# Patient Record
Sex: Female | Born: 1995 | Race: Black or African American | Hispanic: No | Marital: Single | Smoking: Never smoker
Health system: Southern US, Community
[De-identification: ages and names within clinical notes are randomized; demographics above are authoritative.]

## PROBLEM LIST (undated history)

## (undated) DIAGNOSIS — D649 Anemia, unspecified: Secondary | ICD-10-CM

## (undated) HISTORY — PX: NO PAST SURGERIES: SHX2092

## (undated) HISTORY — DX: Anemia, unspecified: D64.9

---
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
## 2017-11-03 ENCOUNTER — Encounter: Payer: Self-pay | Admitting: Internal Medicine

## 2017-11-03 ENCOUNTER — Ambulatory Visit: Payer: Self-pay | Admitting: Internal Medicine

## 2017-11-03 VITALS — BP 124/70 | HR 76 | Resp 12 | Ht 67.0 in | Wt 226.0 lb

## 2017-11-03 DIAGNOSIS — Z0000 Encounter for general adult medical examination without abnormal findings: Secondary | ICD-10-CM

## 2017-11-03 DIAGNOSIS — E010 Iodine-deficiency related diffuse (endemic) goiter: Secondary | ICD-10-CM

## 2017-11-03 DIAGNOSIS — D509 Iron deficiency anemia, unspecified: Secondary | ICD-10-CM

## 2017-11-03 DIAGNOSIS — D649 Anemia, unspecified: Secondary | ICD-10-CM | POA: Insufficient documentation

## 2017-11-03 LAB — POCT URINALYSIS DIPSTICK
BILIRUBIN UA: NEGATIVE
Glucose, UA: NEGATIVE
KETONES UA: NEGATIVE
Leukocytes, UA: NEGATIVE
Nitrite, UA: NEGATIVE
Protein, UA: POSITIVE — AB
Urobilinogen, UA: 0.2 E.U./dL
pH, UA: 6 (ref 5.0–8.0)

## 2017-11-03 NOTE — Patient Instructions (Signed)
Drink a glass of water before every meal Drink 6-8 glasses of water daily Eat three meals daily Eat a protein and healthy fat with every meal (eggs,fish, chicken, [place] and limit red meats) Eat 5 servings of vegetables daily, mix the colors Eat 2 servings of fruit daily with skin, if skin is edible Use smaller plates Put food/utensils down as you chew and swallow each bite Eat at a table with friends/family at least once daily, no TV Do not eat in front of the TV  Recent studies show that people who consume all of their calories in a 12 hour period lose weight more efficiently.  For example, if you eat your first meal at 7:00 a.m., your last meal of the day should be completed by 7:00 p.m.  Call if you decide to cancel the surgery

## 2017-11-03 NOTE — Progress Notes (Signed)
Subjective:    Patient ID: Beth Camacho, female    DOB: 02/22/1995, 22 y.o.   MRN: [id number]  HPI   Here to establish  Plans for liposuction with [facility] in [facility].  Patient needs medical clearance and labs done so she can proceed.   States she has tried everything possible to lose her midsection, but unable to do so, so is planning for the liposuction. She was in good shape in high school with sports.  Got a job after high school with [employer] and gained a lot of weight Tried to lose weight over past year with diet and exercise, but unable to get her midsection down. Weighed 150 lb in high school.  Diet/lifestyle: Gets up at 10 a.m. Lives in a house with roommates  First intake:  1-3 pm: cabbage/foufou.  Juice: orange or grape drink.  May have grilled chicken white rice.  Spaghetti.    6-7 pm.:  Sometimes same as earlier.  Juice sometimes water.    Bedtime at 1-2 a.m.  Later states she is now working 2nd shift in [place] [employer]:  At a [employer].  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  Works from 5:30 pm to 2:30 am M-F.   Not taking a full load of classes currently and doing fine in classes.  After November, quitting job.        Immunization History  Administered Date(s) Administered  . Hpv 10/06/2007  . Td 12/02/2007  . Tdap 06/01/2007     No outpatient medications have been marked as taking for the 11/03/17 encounter (Office Visit) with Julieanne Manson, MD.    No Known Allergies   Past Medical History:  Diagnosis Date  . Anemia    iron deficiency--noted when tried to give blood in past    History reviewed. No pertinent surgical history.   History reviewed. No pertinent family history.   Social History   Socioeconomic History  . Marital status: Single    Spouse name: Not on file  . Number of children: 0  . Years of education: Not on file  . Highest education level: Not on file    Occupational History  . 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 Financial resource strain: Not on file  . Food insecurity:    Worry: Never true    Inability: Never true  . Transportation needs:    Medical: Yes    Non-medical: Yes  Tobacco Use  . Smoking status: Never Smoker  . Smokeless tobacco: Never Used  Substance and Sexual Activity  . Alcohol use: No  . Drug use: No  . Sexual activity: Not Currently    Comment: no intercourse since 09/2016  Lifestyle  . Physical activity:    Days per week: Not on file    Minutes per session: Not on file  . Stress: Not on file  Relationships  . Social connections:    Talks on phone: Not on file    Gets together: Not on file    Attends religious service: Not on file    Active member of club or organization: Not on file    Attends meetings of clubs or organizations: Not on file    Relationship status: Not on file  . Intimate partner violence:    Fear of current or ex partner: Not on file    Emotionally abused: No    Physically abused: No  Forced sexual activity: Not on file  Other Topics Concern  . Not on file  Social History Narrative   Parents live in [place]   She is a Consulting civil engineer at [employer], Holiday representative in business   Lives with roommates near campus    Review of Systems     Objective:   Physical Exam Obese, NAD HEENT: PERRL, EOMI, discs sharp, TMs pearly gray, throat without injection. Neck:  Supple, No adenopathy, prominent anterior skin fold vs somewhat prominent thyroid Chest:  CTA CV:  RRR with normal S1 and S2, No S3, S4 or murmur.  Radial and DP pulses normal and equal Abd:  S, NT, No HSM or mass, + BS LE: No edema Neuro:  A & O x 3, CN II-XII grossly intact.  DTRs 2+/4 throughout, Motor 5/5 throughout.  Gait normal       Assessment & Plan:  1.  Medical clearance for surgery Labs requested preop ordered.  Adding a TSH, though suspect this is just her neck fold Nothing in her  history or exam to preclude her from surgery Strongly urged her to put the liposuction on hold and work with [place] at clinic on lifestyle changes to improve her weight. Also encouraged her to let her parents know she is planning this as she is heading to [place]. Based on her current diet and lifestyle, am concerned she will just regain the weight as has not made appropriate changes. Urged her to have protein and good fat with each meal and cut back on all her carbs. Discussed her diet at length. Also to work in physical activity during day and then an hour of regular physical activity just for her daily. She will think about this.

## 2017-11-03 NOTE — Progress Notes (Signed)
Social work Tax inspector (SWI) assessed Beth Camacho for mental health symptoms and social determinants of health. Over the past two weeks she has had trouble falling asleep, has felt tired, and has felt stress nearly every day. She has overeaten and felt anxious more than half the days. Patient denied any suicidal thoughts.   [name] has had no food insecurities, transportation challenges, or incidences of physical or emotional abuse over the past year. She has had some transportation challenges and stress about getting the bills paid on time. [name] made a counseling appointment with the SWI to discuss her anxiety.

## 2017-11-04 LAB — PROTIME-INR
INR: 1 (ref 0.8–1.2)
PROTHROMBIN TIME: 10.8 s (ref 9.1–12.0)

## 2017-11-04 LAB — CBC WITH DIFFERENTIAL/PLATELET
BASOS ABS: 0 10*3/uL (ref 0.0–0.2)
Basos: 1 %
EOS (ABSOLUTE): 0 10*3/uL (ref 0.0–0.4)
Eos: 1 %
HEMOGLOBIN: 10.5 g/dL — AB (ref 11.1–15.9)
Hematocrit: 33.5 % — ABNORMAL LOW (ref 34.0–46.6)
IMMATURE GRANS (ABS): 0 10*3/uL (ref 0.0–0.1)
IMMATURE GRANULOCYTES: 0 %
Lymphocytes Absolute: 2.1 10*3/uL (ref 0.7–3.1)
Lymphs: 37 %
MCH: 22.4 pg — ABNORMAL LOW (ref 26.6–33.0)
MCHC: 31.3 g/dL — ABNORMAL LOW (ref 31.5–35.7)
MCV: 71 fL — ABNORMAL LOW (ref 79–97)
MONOCYTES: 7 %
Monocytes Absolute: 0.4 10*3/uL (ref 0.1–0.9)
NEUTROS PCT: 54 %
Neutrophils Absolute: 3.1 10*3/uL (ref 1.4–7.0)
PLATELETS: 342 10*3/uL (ref 150–450)
RBC: 4.69 x10E6/uL (ref 3.77–5.28)
RDW: 15.1 % (ref 12.3–15.4)
WBC: 5.7 10*3/uL (ref 3.4–10.8)

## 2017-11-04 LAB — COMPREHENSIVE METABOLIC PANEL
A/G RATIO: 1.1 — AB (ref 1.2–2.2)
ALT: 8 IU/L (ref 0–32)
AST: 16 IU/L (ref 0–40)
Albumin: 4 g/dL (ref 3.5–5.5)
Alkaline Phosphatase: 104 IU/L (ref 39–117)
BILIRUBIN TOTAL: 0.3 mg/dL (ref 0.0–1.2)
BUN/Creatinine Ratio: 19 (ref 9–23)
BUN: 11 mg/dL (ref 6–20)
CALCIUM: 9.1 mg/dL (ref 8.7–10.2)
CHLORIDE: 106 mmol/L (ref 96–106)
CO2: 22 mmol/L (ref 20–29)
Creatinine, Ser: 0.59 mg/dL (ref 0.57–1.00)
GFR calc non Af Amer: 132 mL/min/{1.73_m2} (ref >59)
GFR, EST AFRICAN AMERICAN: 153 mL/min/{1.73_m2} (ref >59)
GLUCOSE: 83 mg/dL (ref 65–99)
Globulin, Total: 3.5 g/dL (ref 1.5–4.5)
POTASSIUM: 4.2 mmol/L (ref 3.5–5.2)
Sodium: 142 mmol/L (ref 134–144)
TOTAL PROTEIN: 7.5 g/dL (ref 6.0–8.5)

## 2017-11-04 LAB — HIV ANTIBODY (ROUTINE TESTING W REFLEX): HIV SCREEN 4TH GENERATION: NONREACTIVE

## 2017-11-04 LAB — BETA HCG QUANT (REF LAB): hCG Quant: <1 m[IU]/mL

## 2017-11-04 LAB — APTT: APTT: 25 s (ref 24–33)

## 2017-11-04 LAB — TSH: TSH: 2.26 u[IU]/mL (ref 0.450–4.500)

## 2017-11-12 ENCOUNTER — Other Ambulatory Visit: Payer: Self-pay | Admitting: Licensed Clinical Social Worker

## 2017-11-25 ENCOUNTER — Other Ambulatory Visit: Payer: Self-pay

## 2017-11-25 DIAGNOSIS — D509 Iron deficiency anemia, unspecified: Secondary | ICD-10-CM

## 2017-11-26 LAB — CBC WITH DIFFERENTIAL/PLATELET
BASOS ABS: 0 10*3/uL (ref 0.0–0.2)
Basos: 0 %
EOS (ABSOLUTE): 0 10*3/uL (ref 0.0–0.4)
Eos: 0 %
HEMOGLOBIN: 10.8 g/dL — AB (ref 11.1–15.9)
Hematocrit: 35.7 % (ref 34.0–46.6)
Immature Grans (Abs): 0 10*3/uL (ref 0.0–0.1)
Immature Granulocytes: 0 %
LYMPHS ABS: 3.3 10*3/uL — AB (ref 0.7–3.1)
Lymphs: 48 %
MCH: 22.5 pg — ABNORMAL LOW (ref 26.6–33.0)
MCHC: 30.3 g/dL — AB (ref 31.5–35.7)
MCV: 75 fL — ABNORMAL LOW (ref 79–97)
MONOCYTES: 8 %
Monocytes Absolute: 0.5 10*3/uL (ref 0.1–0.9)
NEUTROS PCT: 44 %
Neutrophils Absolute: 3.1 10*3/uL (ref 1.4–7.0)
Platelets: 330 10*3/uL (ref 150–450)
RBC: 4.79 x10E6/uL (ref 3.77–5.28)
RDW: 16.3 % — AB (ref 12.3–15.4)
WBC: 7 10*3/uL (ref 3.4–10.8)

## 2018-02-07 ENCOUNTER — Ambulatory Visit: Payer: Self-pay | Admitting: Internal Medicine

## 2018-11-09 ENCOUNTER — Ambulatory Visit: Payer: Self-pay | Admitting: Internal Medicine

## 2018-11-09 ENCOUNTER — Encounter: Payer: Self-pay | Admitting: Internal Medicine

## 2019-07-25 ENCOUNTER — Ambulatory Visit: Payer: Self-pay | Admitting: Internal Medicine

## 2019-09-05 ENCOUNTER — Ambulatory Visit: Payer: Self-pay | Admitting: Internal Medicine

## 2021-01-10 ENCOUNTER — Ambulatory Visit: Payer: Self-pay | Admitting: Internal Medicine

## 2021-10-11 ENCOUNTER — Encounter (HOSPITAL_COMMUNITY): Payer: Self-pay

## 2021-10-11 ENCOUNTER — Other Ambulatory Visit: Payer: Self-pay

## 2021-10-11 ENCOUNTER — Emergency Department (HOSPITAL_COMMUNITY)
Admission: EM | Admit: 2021-10-11 | Discharge: 2021-10-11 | Disposition: A | Discharge status: Home / Self Care | Payer: Self-pay | Attending: Emergency Medicine | Admitting: Emergency Medicine

## 2021-10-11 DIAGNOSIS — N3000 Acute cystitis without hematuria: Secondary | ICD-10-CM | POA: Insufficient documentation

## 2021-10-11 LAB — URINALYSIS, ROUTINE W REFLEX MICROSCOPIC
Bacteria, UA: NONE SEEN
Bilirubin Urine: NEGATIVE
Glucose, UA: NEGATIVE mg/dL
Ketones, ur: NEGATIVE mg/dL
Nitrite: NEGATIVE
Protein, ur: NEGATIVE mg/dL
RBC / HPF: >50 RBC/hpf — ABNORMAL HIGH (ref 0–5)
Specific Gravity, Urine: 1.02 (ref 1.005–1.030)
WBC, UA: >50 WBC/hpf — ABNORMAL HIGH (ref 0–5)
pH: 5 (ref 5.0–8.0)

## 2021-10-11 LAB — I-STAT BETA HCG BLOOD, ED (MC, WL, AP ONLY): I-stat hCG, quantitative: <5 m[IU]/mL (ref <5)

## 2021-10-11 MED ORDER — CEPHALEXIN 500 MG PO CAPS
500.0000 mg | ORAL_CAPSULE | Freq: Once | ORAL | Status: AC
  Administered 2021-10-11: 500 mg via ORAL
  Filled 2021-10-11: qty 1

## 2021-10-11 MED ORDER — FLUCONAZOLE 150 MG PO TABS: 150.0000 mg | ORAL_TABLET | Freq: Every day | ORAL | 0 refills | Status: AC

## 2021-10-11 MED ORDER — CEPHALEXIN 500 MG PO CAPS: 500.0000 mg | ORAL_CAPSULE | Freq: Two times a day (BID) | ORAL | 0 refills | Status: AC

## 2021-10-11 NOTE — ED Provider Notes (Signed)
[facility] Emergency Department Provider Note MRN:  [id number]  Arrival date & time: 10/11/21     Chief Complaint   Urinary Frequency   History of Present Illness   Beth Camacho is a 26 y.o. year-old female presents to the ED with chief complaint of dysuria, urinary frequency, and urgency.  She states that she has the symptoms intermittently, but this episode has been going on for the past week.  She denies fevers or chills.  Denies nausea or vomiting.  Denies any low back pain.  Additionally, she states that she has had some thick white vaginal discharge is also intermittent..     Review of Systems  Pertinent positive and negative review of systems noted in HPI.    Physical Exam   Vitals:   10/11/21 2026 10/11/21 2030  BP: 139/87   Resp: 18   Temp: 98.3 F (36.8 C)   SpO2: 100% 100%    CONSTITUTIONAL:  well-appearing, NAD NEURO:  Alert and oriented x 3, CN 3-12 grossly intact EYES:  eyes equal and reactive ENT/NECK:  Supple, no stridor  CARDIO:  appears well-perfused  PULM:  No respiratory distress,  GI/GU:  non-distended, non tender MSK/SPINE:  No gross deformities, no edema, moves all extremities  SKIN:  no rash, atraumatic   *Additional and/or pertinent findings included in MDM below  Diagnostic and Interventional Summary    EKG Interpretation  Date/Time:    Ventricular Rate:    PR Interval:    QRS Duration:   QT Interval:    QTC Calculation:   R Axis:     Text Interpretation:         Labs Reviewed  URINALYSIS, ROUTINE W REFLEX MICROSCOPIC - Abnormal; Notable for the following components:      Result Value   APPearance HAZY (*)    Hgb urine dipstick LARGE (*)    Leukocytes,Ua LARGE (*)    RBC / HPF >50 (*)    WBC, UA >50 (*)    All other components within normal limits  I-STAT BETA HCG BLOOD, ED ([facility], [facility], [facility] ONLY)    No orders to display    Medications  cephALEXin (KEFLEX) capsule 500 mg (500 mg Oral Given  10/11/21 2318)     Procedures  /  Critical Care Procedures  ED Course and Medical Decision Making  I have reviewed the triage vital signs, the nursing notes, and pertinent available records from the EMR.  Social Determinants Affecting Complexity of Care: Patient has no clinically significant social determinants affecting this chief complaint..   ED Course:    Medical Decision Making Patient here with dysuria and vaginal discharge.  UA is consistent with infection.  Pregnancy test negative.  Offered self swab versus referral, but patient states that she would like to try the antibiotics and Diflucan.  She will follow-up if not improving.  Amount and/or Complexity of Data Reviewed Labs:     Details: Urinalysis ordered in triage is consistent with infection Pregnancy test negative  Risk Prescription drug management.     Consultants: No consultations were needed in caring for this patient.   Treatment and Plan: Emergency department workup does not suggest an emergent condition requiring admission or immediate intervention beyond  what has been performed at this time. The patient is safe for discharge and has  been instructed to return immediately for worsening symptoms, change in  symptoms or any other concerns    Final Clinical Impressions(s) / ED Diagnoses  ICD-10-CM   1. Acute cystitis without hematuria  N30.00       ED Discharge Orders          Ordered    cephALEXin (KEFLEX) 500 MG capsule  2 times daily        10/11/21 2317    fluconazole (DIFLUCAN) 150 MG tablet  Daily        10/11/21 2317              Discharge Instructions Discussed with and Provided to Patient:    Discharge Instructions      If you do not improve with the antibiotics, please follow-up with your doctor or a gynecologist.       Roxy Horseman, PA-C 10/11/21 2322    Nira Conn, MD 10/12/21 [phone]

## 2021-10-11 NOTE — ED Triage Notes (Signed)
Patient has been experiencing painful urination with frequency for a few months.

## 2021-10-11 NOTE — Discharge Instructions (Signed)
If you do not improve with the antibiotics, please follow-up with your doctor or a gynecologist.

## 2022-02-28 ENCOUNTER — Emergency Department (HOSPITAL_COMMUNITY): Payer: Self-pay

## 2022-02-28 ENCOUNTER — Encounter (HOSPITAL_COMMUNITY)
Admission: EM | Disposition: A | Discharge status: Home / Self Care | Payer: Self-pay | Source: Home / Self Care | Attending: Emergency Medicine

## 2022-02-28 ENCOUNTER — Emergency Department (HOSPITAL_BASED_OUTPATIENT_CLINIC_OR_DEPARTMENT_OTHER): Payer: Self-pay | Admitting: Anesthesiology

## 2022-02-28 ENCOUNTER — Encounter (HOSPITAL_COMMUNITY): Payer: Self-pay

## 2022-02-28 ENCOUNTER — Emergency Department (HOSPITAL_COMMUNITY): Payer: Self-pay | Admitting: Anesthesiology

## 2022-02-28 ENCOUNTER — Observation Stay (HOSPITAL_COMMUNITY)
Admission: EM | Admit: 2022-02-28 | Discharge: 2022-02-28 | Disposition: A | Discharge status: Home / Self Care | Payer: Self-pay | Attending: Emergency Medicine | Admitting: Emergency Medicine

## 2022-02-28 DIAGNOSIS — Z3A00 Weeks of gestation of pregnancy not specified: Secondary | ICD-10-CM

## 2022-02-28 DIAGNOSIS — Z6791 Unspecified blood type, Rh negative: Secondary | ICD-10-CM

## 2022-02-28 DIAGNOSIS — O00111 Right tubal pregnancy with intrauterine pregnancy: Secondary | ICD-10-CM

## 2022-02-28 DIAGNOSIS — O00101 Right tubal pregnancy without intrauterine pregnancy: Principal | ICD-10-CM | POA: Insufficient documentation

## 2022-02-28 DIAGNOSIS — O009 Unspecified ectopic pregnancy without intrauterine pregnancy: Secondary | ICD-10-CM

## 2022-02-28 DIAGNOSIS — K661 Hemoperitoneum: Secondary | ICD-10-CM

## 2022-02-28 DIAGNOSIS — Z9889 Other specified postprocedural states: Secondary | ICD-10-CM

## 2022-02-28 HISTORY — PX: LAPAROSCOPIC BILATERAL SALPINGECTOMY: SHX5889

## 2022-02-28 LAB — CBC WITH DIFFERENTIAL/PLATELET
Abs Immature Granulocytes: 0.01 10*3/uL (ref 0.00–0.07)
Basophils Absolute: 0 10*3/uL (ref 0.0–0.1)
Basophils Relative: 1 %
Eosinophils Absolute: 0 10*3/uL (ref 0.0–0.5)
Eosinophils Relative: 1 %
HCT: 37.6 % (ref 36.0–46.0)
Hemoglobin: 11.9 g/dL — ABNORMAL LOW (ref 12.0–15.0)
Immature Granulocytes: 0 %
Lymphocytes Relative: 44 %
Lymphs Abs: 2.2 10*3/uL (ref 0.7–4.0)
MCH: 24 pg — ABNORMAL LOW (ref 26.0–34.0)
MCHC: 31.6 g/dL (ref 30.0–36.0)
MCV: 75.8 fL — ABNORMAL LOW (ref 80.0–100.0)
Monocytes Absolute: 0.4 10*3/uL (ref 0.1–1.0)
Monocytes Relative: 7 %
Neutro Abs: 2.3 10*3/uL (ref 1.7–7.7)
Neutrophils Relative %: 47 %
Platelets: 312 10*3/uL (ref 150–400)
RBC: 4.96 MIL/uL (ref 3.87–5.11)
RDW: 18 % — ABNORMAL HIGH (ref 11.5–15.5)
WBC: 5 10*3/uL (ref 4.0–10.5)
nRBC: 0 % (ref 0.0–0.2)

## 2022-02-28 LAB — URINALYSIS, ROUTINE W REFLEX MICROSCOPIC
Bilirubin Urine: NEGATIVE
Glucose, UA: NEGATIVE mg/dL
Ketones, ur: NEGATIVE mg/dL
Nitrite: NEGATIVE
Protein, ur: 30 mg/dL — AB
Specific Gravity, Urine: 1.031 — ABNORMAL HIGH (ref 1.005–1.030)
pH: 5 (ref 5.0–8.0)

## 2022-02-28 LAB — WET PREP, GENITAL
Sperm: NONE SEEN
Trich, Wet Prep: NONE SEEN
WBC, Wet Prep HPF POC: >=10 — AB (ref <10)

## 2022-02-28 LAB — COMPREHENSIVE METABOLIC PANEL
ALT: 10 U/L (ref 0–44)
AST: 14 U/L — ABNORMAL LOW (ref 15–41)
Albumin: 3.7 g/dL (ref 3.5–5.0)
Alkaline Phosphatase: 53 U/L (ref 38–126)
Anion gap: 7 (ref 5–15)
BUN: 7 mg/dL (ref 6–20)
CO2: 21 mmol/L — ABNORMAL LOW (ref 22–32)
Calcium: 9 mg/dL (ref 8.9–10.3)
Chloride: 109 mmol/L (ref 98–111)
Creatinine, Ser: 0.49 mg/dL (ref 0.44–1.00)
GFR, Estimated: >60 mL/min (ref >60)
Glucose, Bld: 106 mg/dL — ABNORMAL HIGH (ref 70–99)
Potassium: 3.5 mmol/L (ref 3.5–5.1)
Sodium: 137 mmol/L (ref 135–145)
Total Bilirubin: 0.5 mg/dL (ref 0.3–1.2)
Total Protein: 7.2 g/dL (ref 6.5–8.1)

## 2022-02-28 LAB — ABO/RH: ABO/RH(D): B NEG

## 2022-02-28 LAB — TYPE AND SCREEN
ABO/RH(D): B NEG
ABO/RH(D): B NEG
Antibody Screen: NEGATIVE
Antibody Screen: NEGATIVE

## 2022-02-28 LAB — LIPASE, BLOOD: Lipase: 39 U/L (ref 11–51)

## 2022-02-28 LAB — HCG, QUANTITATIVE, PREGNANCY: hCG, Beta Chain, Quant, S: 25664 m[IU]/mL — ABNORMAL HIGH (ref <5)

## 2022-02-28 LAB — I-STAT BETA HCG BLOOD, ED (MC, WL, AP ONLY): I-stat hCG, quantitative: >2000 m[IU]/mL — ABNORMAL HIGH (ref <5)

## 2022-02-28 SURGERY — SALPINGECTOMY, BILATERAL, LAPAROSCOPIC
Anesthesia: General | Laterality: Right

## 2022-02-28 MED ORDER — HYDROMORPHONE HCL 1 MG/ML IJ SOLN
0.2500 mg | INTRAMUSCULAR | Status: DC | PRN
  Administered 2022-02-28 (×2): 0.5 mg via INTRAVENOUS

## 2022-02-28 MED ORDER — HYDROMORPHONE HCL 1 MG/ML IJ SOLN
INTRAMUSCULAR | Status: AC
  Filled 2022-02-28: qty 1

## 2022-02-28 MED ORDER — AMISULPRIDE (ANTIEMETIC) 5 MG/2ML IV SOLN
10.0000 mg | Freq: Once | INTRAVENOUS | Status: AC | PRN
  Administered 2022-02-28: 10 mg via INTRAVENOUS

## 2022-02-28 MED ORDER — SUGAMMADEX SODIUM 200 MG/2ML IV SOLN
INTRAVENOUS | Status: DC | PRN
  Administered 2022-02-28: 400 mg via INTRAVENOUS

## 2022-02-28 MED ORDER — ROCURONIUM BROMIDE 10 MG/ML (PF) SYRINGE
PREFILLED_SYRINGE | INTRAVENOUS | Status: DC | PRN
  Administered 2022-02-28: 40 mg via INTRAVENOUS

## 2022-02-28 MED ORDER — PROPOFOL 10 MG/ML IV BOLUS
INTRAVENOUS | Status: DC | PRN
  Administered 2022-02-28: 200 mg via INTRAVENOUS

## 2022-02-28 MED ORDER — BUPIVACAINE HCL (PF) 0.5 % IJ SOLN
INTRAMUSCULAR | Status: AC
  Filled 2022-02-28: qty 30

## 2022-02-28 MED ORDER — OXYCODONE HCL 5 MG PO TABS
ORAL_TABLET | ORAL | Status: AC
  Filled 2022-02-28: qty 1

## 2022-02-28 MED ORDER — LACTATED RINGERS IV SOLN: INTRAVENOUS | Status: DC

## 2022-02-28 MED ORDER — OXYCODONE-ACETAMINOPHEN 5-325 MG PO TABS: 1.0000 | ORAL_TABLET | Freq: Four times a day (QID) | ORAL | 0 refills | Status: AC | PRN

## 2022-02-28 MED ORDER — MEPERIDINE HCL 25 MG/ML IJ SOLN: 6.2500 mg | INTRAMUSCULAR | Status: DC | PRN

## 2022-02-28 MED ORDER — SUCCINYLCHOLINE CHLORIDE 200 MG/10ML IV SOSY
PREFILLED_SYRINGE | INTRAVENOUS | Status: DC | PRN
  Administered 2022-02-28: 100 mg via INTRAVENOUS

## 2022-02-28 MED ORDER — MIDAZOLAM HCL 2 MG/2ML IJ SOLN
INTRAMUSCULAR | Status: DC | PRN
  Administered 2022-02-28: 2 mg via INTRAVENOUS

## 2022-02-28 MED ORDER — AMISULPRIDE (ANTIEMETIC) 5 MG/2ML IV SOLN
INTRAVENOUS | Status: AC
  Filled 2022-02-28: qty 4

## 2022-02-28 MED ORDER — IBUPROFEN 200 MG PO TABS: 600.0000 mg | ORAL_TABLET | Freq: Four times a day (QID) | ORAL | 0 refills | Status: AC | PRN

## 2022-02-28 MED ORDER — PROPOFOL 10 MG/ML IV BOLUS
INTRAVENOUS | Status: AC
  Filled 2022-02-28: qty 20

## 2022-02-28 MED ORDER — RHO D IMMUNE GLOBULIN 1500 UNIT/2ML IJ SOSY
300.0000 ug | PREFILLED_SYRINGE | Freq: Once | INTRAMUSCULAR | Status: AC
  Administered 2022-02-28: 300 ug via INTRAMUSCULAR
  Filled 2022-02-28: qty 2

## 2022-02-28 MED ORDER — SODIUM CHLORIDE 0.9 % IR SOLN
Status: DC | PRN
  Administered 2022-02-28: 3000 mL

## 2022-02-28 MED ORDER — RHO D IMMUNE GLOBULIN 1500 UNIT/2ML IJ SOSY
300.0000 ug | PREFILLED_SYRINGE | Freq: Once | INTRAMUSCULAR | Status: DC
  Filled 2022-02-28: qty 2

## 2022-02-28 MED ORDER — PROMETHAZINE HCL 25 MG/ML IJ SOLN: 6.2500 mg | INTRAMUSCULAR | Status: DC | PRN

## 2022-02-28 MED ORDER — SCOPOLAMINE 1 MG/3DAYS TD PT72
MEDICATED_PATCH | TRANSDERMAL | Status: AC
  Administered 2022-02-28: 1.5 mg via TRANSDERMAL
  Filled 2022-02-28: qty 1

## 2022-02-28 MED ORDER — ONDANSETRON HCL 4 MG/2ML IJ SOLN
INTRAMUSCULAR | Status: DC | PRN
  Administered 2022-02-28: 4 mg via INTRAVENOUS

## 2022-02-28 MED ORDER — FENTANYL CITRATE (PF) 250 MCG/5ML IJ SOLN
INTRAMUSCULAR | Status: AC
  Filled 2022-02-28: qty 5

## 2022-02-28 MED ORDER — ORAL CARE MOUTH RINSE: 15.0000 mL | Freq: Once | OROMUCOSAL | Status: AC

## 2022-02-28 MED ORDER — DOCUSATE SODIUM 100 MG PO CAPS: 100.0000 mg | ORAL_CAPSULE | Freq: Two times a day (BID) | ORAL | 0 refills | Status: AC

## 2022-02-28 MED ORDER — ACETAMINOPHEN 500 MG PO TABS: 1000.0000 mg | ORAL_TABLET | Freq: Once | ORAL | Status: AC

## 2022-02-28 MED ORDER — OXYCODONE HCL 5 MG/5ML PO SOLN: 5.0000 mg | Freq: Once | ORAL | Status: AC | PRN

## 2022-02-28 MED ORDER — CHLORHEXIDINE GLUCONATE 0.12 % MT SOLN: 15.0000 mL | Freq: Once | OROMUCOSAL | Status: AC

## 2022-02-28 MED ORDER — FENTANYL CITRATE (PF) 250 MCG/5ML IJ SOLN
INTRAMUSCULAR | Status: DC | PRN
  Administered 2022-02-28: 100 ug via INTRAVENOUS
  Administered 2022-02-28: 150 ug via INTRAVENOUS

## 2022-02-28 MED ORDER — DEXAMETHASONE SODIUM PHOSPHATE 10 MG/ML IJ SOLN
INTRAMUSCULAR | Status: DC | PRN
  Administered 2022-02-28: 5 mg via INTRAVENOUS

## 2022-02-28 MED ORDER — DIPHENHYDRAMINE HCL 50 MG/ML IJ SOLN
INTRAMUSCULAR | Status: DC | PRN
  Administered 2022-02-28: 12.5 mg via INTRAVENOUS

## 2022-02-28 MED ORDER — KETOROLAC TROMETHAMINE 30 MG/ML IJ SOLN
INTRAMUSCULAR | Status: DC | PRN
  Administered 2022-02-28: 30 mg via INTRAVENOUS

## 2022-02-28 MED ORDER — ACETAMINOPHEN 500 MG PO TABS
ORAL_TABLET | ORAL | Status: AC
  Administered 2022-02-28: 1000 mg via ORAL
  Filled 2022-02-28: qty 2

## 2022-02-28 MED ORDER — SCOPOLAMINE 1 MG/3DAYS TD PT72: 1.0000 | MEDICATED_PATCH | TRANSDERMAL | Status: DC

## 2022-02-28 MED ORDER — BUPIVACAINE HCL 0.5 % IJ SOLN
INTRAMUSCULAR | Status: DC | PRN
  Administered 2022-02-28: 10 mL

## 2022-02-28 MED ORDER — MIDAZOLAM HCL 2 MG/2ML IJ SOLN
INTRAMUSCULAR | Status: AC
  Filled 2022-02-28: qty 2

## 2022-02-28 MED ORDER — OXYCODONE HCL 5 MG PO TABS
5.0000 mg | ORAL_TABLET | Freq: Once | ORAL | Status: AC | PRN
  Administered 2022-02-28: 5 mg via ORAL

## 2022-02-28 MED ORDER — LIDOCAINE 2% (20 MG/ML) 5 ML SYRINGE
INTRAMUSCULAR | Status: DC | PRN
  Administered 2022-02-28: 100 mg via INTRAVENOUS

## 2022-02-28 MED ORDER — CHLORHEXIDINE GLUCONATE 0.12 % MT SOLN
OROMUCOSAL | Status: AC
  Administered 2022-02-28: 15 mL via OROMUCOSAL
  Filled 2022-02-28: qty 15

## 2022-02-28 SURGICAL SUPPLY — 40 items
ADH SKN CLS APL DERMABOND .7 (GAUZE/BANDAGES/DRESSINGS) ×1
APL SWBSTK 6 STRL LF DISP (MISCELLANEOUS) ×1
APPLICATOR COTTON TIP 6 STRL (MISCELLANEOUS) ×1 IMPLANT
APPLICATOR COTTON TIP 6IN STRL (MISCELLANEOUS) ×1 IMPLANT
BLADE SURG 15 STRL LF DISP TIS (BLADE) ×1 IMPLANT
BLADE SURG 15 STRL SS (BLADE) ×1
CABLE HIGH FREQUENCY MONO STRZ (ELECTRODE) IMPLANT
DEFOGGER SCOPE WARMER CLEARIFY (MISCELLANEOUS) ×1 IMPLANT
DERMABOND ADVANCED .7 DNX12 (GAUZE/BANDAGES/DRESSINGS) ×1 IMPLANT
DRSG OPSITE POSTOP 3X4 (GAUZE/BANDAGES/DRESSINGS) IMPLANT
DURAPREP 26ML APPLICATOR (WOUND CARE) ×1 IMPLANT
ELECT REM PT RETURN 9FT ADLT (ELECTROSURGICAL) ×1
ELECTRODE REM PT RTRN 9FT ADLT (ELECTROSURGICAL) ×1 IMPLANT
GLOVE BIOGEL PI IND STRL 7.0 (GLOVE) ×2 IMPLANT
GLOVE BIOGEL PI IND STRL 7.5 (GLOVE) ×2 IMPLANT
GLOVE SURG SS PI 7.0 STRL IVOR (GLOVE) ×1 IMPLANT
GOWN STRL REUS W/ TWL LRG LVL3 (GOWN DISPOSABLE) ×3 IMPLANT
GOWN STRL REUS W/TWL LRG LVL3 (GOWN DISPOSABLE) ×3
KIT TURNOVER KIT B (KITS) ×1 IMPLANT
LIGASURE VESSEL 5MM BLUNT TIP (ELECTROSURGICAL) IMPLANT
NS IRRIG 1000ML POUR BTL (IV SOLUTION) ×1 IMPLANT
PACK LAPAROSCOPY BASIN (CUSTOM PROCEDURE TRAY) ×1 IMPLANT
PACK TRENDGUARD 450 HYBRID PRO (MISCELLANEOUS) IMPLANT
PAD OB MATERNITY 4.3X12.25 (PERSONAL CARE ITEMS) ×1 IMPLANT
POUCH LAPAROSCOPIC INSTRUMENT (MISCELLANEOUS) ×1 IMPLANT
PROTECTOR NERVE ULNAR (MISCELLANEOUS) ×2 IMPLANT
SCISSORS LAP 5X35 DISP (ENDOMECHANICALS) IMPLANT
SET TUBE SMOKE EVAC HIGH FLOW (TUBING) ×1 IMPLANT
SLEEVE ADV FIXATION 5X100MM (TROCAR) IMPLANT
SUT MON AB 4-0 PS1 27 (SUTURE) ×1 IMPLANT
SUT VICRYL 0 UR6 27IN ABS (SUTURE) ×1 IMPLANT
SYR 10ML LL (SYRINGE) ×1 IMPLANT
SYS BAG RETRIEVAL 10MM (BASKET) ×1
SYSTEM BAG RETRIEVAL 10MM (BASKET) IMPLANT
TOWEL GREEN STERILE FF (TOWEL DISPOSABLE) ×2 IMPLANT
TRAY FOLEY W/BAG SLVR 14FR (SET/KITS/TRAYS/PACK) ×1 IMPLANT
TRENDGUARD 450 HYBRID PRO PACK (MISCELLANEOUS) ×1
TROCAR ADV FIXATION 5X100MM (TROCAR) IMPLANT
TROCAR BALLN 12MMX100 BLUNT (TROCAR) IMPLANT
WARMER LAPAROSCOPE (MISCELLANEOUS) ×1 IMPLANT

## 2022-02-28 NOTE — Op Note (Incomplete)
Operative Note   03/01/2022  PRE-OP DIAGNOSIS: Right sided tubal ectopic pregnancy with a heartbeat. Rh negative  POST-OP DIAGNOSIS: Same. Hemoperitoneum  SURGEON: Surgeon(s) and Role:    * College Bing, MD - Primary  ASSISTANT: None  ANESTHESIA: General and local   PROCEDURE: Laparoscopic right salpingectomy  ESTIMATED BLOOD LOSS: 10mL EBL. Approximately of hemoperitoneum  DRAINS: indwelling foley UOP  TOTAL IV FLUIDS: per anesthesia note  SPECIMENS: right fallopian tube   VTE PROPHYLAXIS: SCDs to the bilateral lower extremities  ANTIBIOTICS: not indicated  COMPLICATIONS: none  DISPOSITION: PACU - hemodynamically stable.  CONDITION: stable  FINDINGS: Exam under anesthesia revealed normal EGBUS, vagina and cervix.  Laparoscopic survey of the abdomen revealed a large, engorged right fallopian tube, +hemoperitoneum;  grossly normal uterus, left tube, ovaries, liver, and stomach edge; no intra-abdominal adhesions were noted.  PROCEDURE IN DETAIL: The patient was taken to the OR where anesthesia was administed. The patient was positioned in dorsal lithotomy in the Lithium stirrups. The patient was then examined under anesthesia with the above noted findings. The patient was prepped and draped in the normal sterile fashion and foley catheter was placed. A Graves speculum was placed in the vagina and the anterior lip of the cervix was grasped with a single toothed tenaculum.  A Hulka uterine manipulator was then inserted in the uterus and uterine mobility was found to be satisfactory; the speculum and tenaculum were then removed.  After changing gloves, attention was turned to the patient's abdomen where a 12mm skin incision was made in the umbilical fold, after injection of local anesthesia. Using the open technique, the abdomen was entered and the balloon trocar placed; pneumoperitoneum was then obtained. The operative laparoscope was introduced into the abdomen with  the above noted findings, after inspection of the entry site and then placing the patient in Trendelenburg.  Next, the bilateral tubes were traced out to their respective fimbriae, with the above noted findings.  Under direct visualization, a 5mm suprapubic and left lateral lateral trocar were placed after injection of anesthesia.  Using the Ligasure, the serial mesosalpinx on the right side was serially cauterized and cut and transected at the cornua, with the right tube removed via the umbilical port; any free blood was suctioned from the pelvis. The pressure was lowered to and the operative area was hemostatic. The 5mm ports were removed under direct visualization and the gas released via the umbilical port, which was then removed.   The fascia at the umbilical incision was reapproximated with 0 vicryl. The skin was closed with 4-0 monocryl and dermabond, and the 5mm skin incisions closed with Dermabond. . The Hulka was removed with no bleeding noted from the cervix and all other instrumentation was removed from the vagina.  The foley catheter was removed. The patient tolerated the procedure well. All counts were correct x 2. The patient was transferred to the recovery room awake, alert and breathing independently.  Rhogam to be given in the PACU.  Cornelia Copa MD Attending Center for [employer] [facility])

## 2022-02-28 NOTE — Brief Op Note (Signed)
02/28/2022  7:11 PM  PATIENT:  Beth Camacho  27 y.o. female  PRE-OPERATIVE DIAGNOSIS:  right ectopic pregnancy  POST-OPERATIVE DIAGNOSIS:  RIGHT ECTOPIC PREGNANCY  PROCEDURE:  Procedure(s): LAPAROSCOPIC RIGHT SALPINGECTOMY WITH REMOVAL OF ECTOPIC PREGNANCY (Right)  SURGEON:  Surgeon(s) and Role:    * Aletha Halim, MD - Primary   ASSISTANTS: none   ANESTHESIA:   local and general  EBL:  10 mL   BLOOD ADMINISTERED:none  DRAINS:  indwelling foley 18m UOP    LOCAL MEDICATIONS USED:  MARCAINE     SPECIMEN:  right fallopian tube  DISPOSITION OF SPECIMEN:  PATHOLOGY  COUNTS:  YES  TOURNIQUET:  * No tourniquets in log *  DICTATION: .Note written in EPIC  PLAN OF CARE: Discharge to home after PACU  PATIENT DISPOSITION:  PACU - hemodynamically stable.   Delay start of Pharmacological VTE agent (>24hrs) due to surgical blood loss or risk of bleeding: not applicable  CDurene RomansMD Attending Center for [employer](Faculty Practice) 02/28/2022 Time: [phone]

## 2022-02-28 NOTE — ED Provider Notes (Signed)
[facility] Provider Note   CSN: [id number] Arrival date & time: 02/28/22  1106     History  Chief Complaint  Patient presents with   Possible Pregnancy    Beth Camacho is a 27 y.o. female who presents emergency department with concerns for possible pregnancy.  Patient notes that she was evaluated at her OB/GYN [facility]). She notes that she was evaluated by them twice with the most recent visit being on 02/27/2022.  Notes that she had ultrasounds completed on both visits. Her last menstrual cycle was 01/10/2022.  Patient denies having her hCG drawn during her visit with the OB/GYN interim.  Notes that she was sent to the emergency department for hCG.  Has associated suprapubic abdominal pain/cramping.  Denies vaginal bleeding/discharge, nausea, vomiting, urinary symptoms.  The history is provided by the patient. No language interpreter was used.       Home Medications Prior to Admission medications   Medication Sig Start Date End Date Taking? Authorizing Provider  cephALEXin (KEFLEX) 500 MG capsule Take 1 capsule (500 mg total) by mouth 2 (two) times daily. 10/11/21   Montine Circle, PA-C  fluconazole (DIFLUCAN) 150 MG tablet Take 1 tablet (150 mg total) by mouth daily. Take after you finish the antibiotic. 10/11/21   Montine Circle, PA-C      Allergies    Patient has no known allergies.    Review of Systems   Review of Systems  All other systems reviewed and are negative.   Physical Exam Updated Vital Signs BP 128/73 (BP Location: Left Arm)   Pulse 78   Temp 98.5 F (36.9 C) (Oral)   Resp 20   SpO2 100%  Physical Exam Vitals and nursing note reviewed. Exam conducted with a chaperone present.  Constitutional:      General: She is not in acute distress.    Appearance: Normal appearance. She is not ill-appearing, toxic-appearing or diaphoretic.  HENT:     Head: Normocephalic and atraumatic.      Right Ear: External ear normal.     Left Ear: External ear normal.  Eyes:     General: No scleral icterus.    Extraocular Movements: Extraocular movements intact.  Cardiovascular:     Rate and Rhythm: Normal rate and regular rhythm.     Pulses: Normal pulses.     Heart sounds: Normal heart sounds.  Pulmonary:     Effort: Pulmonary effort is normal. No respiratory distress.     Breath sounds: Normal breath sounds.  Abdominal:     General: Abdomen is flat. Bowel sounds are normal. There is no distension.     Palpations: Abdomen is soft. There is no mass.     Tenderness: There is no abdominal tenderness.     Hernia: There is no hernia in the left inguinal area or right inguinal area.  Genitourinary:    Pubic Area: No rash.      Labia:        Right: No rash, tenderness, lesion or injury.        Left: No rash, tenderness, lesion or injury.      Vagina: No signs of injury and foreign body. Vaginal discharge present. No erythema, tenderness or bleeding.     Cervix: Normal.     Uterus: Normal. Not deviated, not enlarged, not fixed and not tender.      Adnexa: Right adnexa normal and left adnexa normal.     Comments: NT chaperone  present for exam.  No appreciable vaginal bleeding, discharge noted on exam.  Cervical os closed. Musculoskeletal:        General: Normal range of motion.     Cervical back: Normal range of motion and neck supple.  Lymphadenopathy:     Lower Body: No right inguinal adenopathy. No left inguinal adenopathy.  Skin:    General: Skin is warm and dry.     Findings: No rash.  Neurological:     Mental Status: She is alert.     Sensory: Sensation is intact.     Motor: Motor function is intact.  Psychiatric:        Behavior: Behavior normal.     ED Results / Procedures / Treatments   Labs (all labs ordered are listed, but only abnormal results are displayed) Labs Reviewed  WET PREP, GENITAL - Abnormal; Notable for the following components:      Result Value    Yeast Wet Prep HPF POC PRESENT (*)    Clue Cells Wet Prep HPF POC PRESENT (*)    WBC, Wet Prep HPF POC >=10 (*)    All other components within normal limits  CBC WITH DIFFERENTIAL/PLATELET - Abnormal; Notable for the following components:   Hemoglobin 11.9 (*)    MCV 75.8 (*)    MCH 24.0 (*)    RDW 18.0 (*)    All other components within normal limits  COMPREHENSIVE METABOLIC PANEL - Abnormal; Notable for the following components:   CO2 21 (*)    Glucose, Bld 106 (*)    AST 14 (*)    All other components within normal limits  URINALYSIS, ROUTINE W REFLEX MICROSCOPIC - Abnormal; Notable for the following components:   Color, Urine AMBER (*)    APPearance HAZY (*)    Specific Gravity, Urine 1.031 (*)    Hgb urine dipstick MODERATE (*)    Protein, ur 30 (*)    Leukocytes,Ua TRACE (*)    Bacteria, UA RARE (*)    All other components within normal limits  I-STAT BETA HCG BLOOD, ED ([facility], [facility], [facility] ONLY) - Abnormal; Notable for the following components:   I-stat hCG, quantitative >2,000.0 (*)    All other components within normal limits  LIPASE, BLOOD  HCG, QUANTITATIVE, PREGNANCY  ABO/RH  TYPE AND SCREEN  GC/CHLAMYDIA PROBE AMP ([place]) NOT AT [facility]    EKG None  Radiology US OB Comp < 14 Wks  Addendum Date: 02/28/2022   ADDENDUM REPORT: 02/28/2022 13:39 ADDENDUM: Critical Value/emergent results were called by telephone at the time of interpretation on 02/28/2022 at 1:39 pm to provider [facility] , who verbally acknowledged these results. Electronically Signed   By: Kerby Moors M.D.   On: 02/28/2022 13:39   Result Date: 02/28/2022 CLINICAL DATA:  Pelvic pain and cramping EXAM: OBSTETRIC <14 WK [place] AND TRANSVAGINAL OB US TECHNIQUE: Both transabdominal and transvaginal ultrasound examinations were performed for complete evaluation of the gestation as well as the maternal uterus, adnexal regions, and pelvic cul-de-sac. Transvaginal technique was performed to assess early pregnancy.  COMPARISON:  None Available. FINDINGS: Intrauterine gestational sac: None Yolk sac:  Not Visualized. Embryo:  Not Visualized. Cardiac Activity: Not Visualized. Maternal uterus/adnexae: Right ovary: Normal. Corpus luteum identified within the right ovary Left ovary: Not visualized Other :Within the right adnexa there is a complex cystic mass measuring 3.8 x 2.5 x 2.8 cm. Within the internal cystic component of this mass a fetal pole is suspected cardiac activity is visualized.  Heart rate within the suspected fetal pole is 120 bpm. Free fluid:  Small volume of free fluid noted within the pelvis. IMPRESSION: 1. No intrauterine gestational sac, yolk sac, or fetal pole identified. 2. Complex mass within the right adnexa adjacent to the right ovary. This has a internal cystic component containing what appears to be a fetal pole with cardiac activity. 3. Small volume of free fluid noted within the pelvis. Electronically Signed: By: Kerby Moors M.D. On: 02/28/2022 13:25   US OB Transvaginal  Addendum Date: 02/28/2022   ADDENDUM REPORT: 02/28/2022 13:39 ADDENDUM: Critical Value/emergent results were called by telephone at the time of interpretation on 02/28/2022 at 1:39 pm to provider [facility] , who verbally acknowledged these results. Electronically Signed   By: Kerby Moors M.D.   On: 02/28/2022 13:39   Result Date: 02/28/2022 CLINICAL DATA:  Pelvic pain and cramping EXAM: OBSTETRIC <14 WK [place] AND TRANSVAGINAL OB US TECHNIQUE: Both transabdominal and transvaginal ultrasound examinations were performed for complete evaluation of the gestation as well as the maternal uterus, adnexal regions, and pelvic cul-de-sac. Transvaginal technique was performed to assess early pregnancy. COMPARISON:  None Available. FINDINGS: Intrauterine gestational sac: None Yolk sac:  Not Visualized. Embryo:  Not Visualized. Cardiac Activity: Not Visualized. Maternal uterus/adnexae: Right ovary: Normal. Corpus luteum identified within  the right ovary Left ovary: Not visualized Other :Within the right adnexa there is a complex cystic mass measuring 3.8 x 2.5 x 2.8 cm. Within the internal cystic component of this mass a fetal pole is suspected cardiac activity is visualized. Heart rate within the suspected fetal pole is 120 bpm. Free fluid:  Small volume of free fluid noted within the pelvis. IMPRESSION: 1. No intrauterine gestational sac, yolk sac, or fetal pole identified. 2. Complex mass within the right adnexa adjacent to the right ovary. This has a internal cystic component containing what appears to be a fetal pole with cardiac activity. 3. Small volume of free fluid noted within the pelvis. Electronically Signed: By: Kerby Moors M.D. On: 02/28/2022 13:25    Procedures Procedures    Medications Ordered in ED Medications - No data to display  ED Course/ Medical Decision Making/ A&P Clinical Course as of 02/28/22 1525  Sat Feb 28, 2022  1201 I-stat hCG, quantitative(!): >2,000.0 [SB]  1326 Consult with [facility] radiology, Dr. Altamese Cabal who notes right ectopic pregnancy on ultrasound. [SB]  [id number] Discussed with patient at bedside regarding findings on ultrasound.  Also discussed with patient wet prep findings.  Discussed with patient that we will speak with OB/GYN regarding next dose.  Answered all available questions at this time. [SB]  67 Dr. Ilda Basset recommends right salpingectomy today. Recommends patient to be transferred to shortstay at [facility] for surgery at 6 PM. Recommends NPO at this time.  [SB]  1513 Discussed with patient recommendations as per OB-GYN. Pt agreeable at this time.  [SB]    Clinical Course User Index [SB] Velva Molinari A, PA-C                             Medical Decision Making Amount and/or Complexity of Data Reviewed Labs: ordered. Decision-making details documented in ED Course. Radiology: ordered.   Patient presents to the ED with concerns for needing hCG completed.  Notes that she was  evaluated at OB/GYN clinic without a CT done.  Was seen by them yesterday and told to come to the emergency department for hCG.  On exam patient with nurse tech chaperone present for exam.  No appreciable vaginal bleeding, discharge noted on exam.  Cervical os closed.  Mild lower abdominal tenderness to palpation.  No acute cardiovascular respiratory exam findings. Differential diagnosis includes ectopic pregnancy, miscarriage, abnormal uterine bleeding, implantation bleeding.     Labs:  I ordered, and personally interpreted labs.  The pertinent results include:   I-stat hCG elevated greater than 2000.  CBC without leukocytosis CMP unremarkable Urinalysis with moderate amount of hemoglobin, trace leukocytes Lipase unremarkable Wet prep notable for yeast and clue cells Gonorrhea and chlamydia ordered results pending at time of transfer. hCG quantitative ordered results pending at time of transfer. ABO/Rh, type and screen ordered with results pending at time of transfer.  Imaging: I ordered imaging studies including transvaginal and OB, ultrasound   I independently visualized and interpreted imaging which showed:  1. No intrauterine gestational sac, yolk sac, or fetal pole  identified.  2. Complex mass within the right adnexa adjacent to the right ovary.  This has a internal cystic component containing what appears to be a  fetal pole with cardiac activity.  3. Small volume of free fluid noted within the pelvis.   I agree with the radiologist interpretation  Consultations: I requested consultation with the OB-GYN, Dr. Ilda Basset and discussed lab and imaging findings as well as pertinent plan - they recommend: Transfer to [facility], short stay for right salpingectomy today.   Disposition: Presentation suspicious for ectopic pregnancy.  Doubt at this time concerns for implantation bleeding.  Doubt concerns at this time for miscarriage or AUB.  After consideration of the diagnostic results and  the patients response to treatment, I feel that the patient would benefit from Transfer to. Discussed lab and imaging findings with patient at bedside. Answered all questions at bedside.  Discussed with patient plans for transfer to [facility], short stay for surgery.  Answered all available questions.  Patient appears safe at this time for transfer.    This chart was dictated using voice recognition software, Dragon. Despite the best efforts of this provider to proofread and correct errors, errors may still occur which can change documentation meaning.   Final Clinical Impression(s) / ED Diagnoses Final diagnoses:  Right tubal pregnancy without intrauterine pregnancy    Rx / DC Orders ED Discharge Orders     None         Joas Motton A, PA-C 02/28/22 1525    Regan Lemming, MD 02/28/22 [phone]

## 2022-02-28 NOTE — ED Triage Notes (Addendum)
Pt arrived via POV, requesting verification of pregnancy. Was seen and had US done but no blood work yet. Denies any pain, bleeding, or cramping.   LMP Dec 30th

## 2022-02-28 NOTE — Anesthesia Procedure Notes (Signed)
Procedure Name: Intubation Date/Time: 02/28/2022 6:17 PM  Performed by: Trinna Post., CRNAPre-anesthesia Checklist: Emergency Drugs available, Suction available, Patient being monitored, Timeout performed and Patient identified Patient Re-evaluated:Patient Re-evaluated prior to induction Oxygen Delivery Method: Circle system utilized Preoxygenation: Pre-oxygenation with 100% oxygen Induction Type: IV induction, Rapid sequence and Cricoid Pressure applied Laryngoscope Size: Mac and 3 Grade View: Grade I Tube type: Oral Tube size: 7.0 mm Number of attempts: 1 Airway Equipment and Method: Stylet Placement Confirmation: ETT inserted through vocal cords under direct vision, positive ETCO2 and breath sounds checked- equal and bilateral Secured at: 23 cm Tube secured with: Tape Dental Injury: Teeth and Oropharynx as per pre-operative assessment

## 2022-02-28 NOTE — H&P (Signed)
Obstetrics & Gynecology H&P   Date of Admission: 02/28/2022   Primary OBGYN: None Primary Care Provider: Mack Hook  Reason for Admission: right ectopic pregnancy with a heartbeat  History of Present Illness: Beth Camacho is a 27 y.o. G2P0010 with the above CC. PMHx is significant for nothing  Patient went to [facility] yesterday for consideration of an abortion and she said they didn't see anything on ultrasound so they recommended she get an hcg and ultrasound.  She went to [facility] ED today to get those things done and was diagnosed with a right adnexal ectopic pregnancy with FHR in the 120s; beta hcg was 25k. Pt is asymptomatic. Last PO of fast food at 10am today.   ROS: A 12-point review of systems was performed and negative, except as stated in the above HPI.  OBGYN History: As per HPI. OB History  Gravida Para Term Preterm AB Living  2       1    SAB IAB Ectopic Multiple Live Births    1          # Outcome Date GA Lbr Len/2nd Weight Sex Delivery Anes PTL Lv  2 Current           1 IAB             Obstetric Comments  G1: elective abortion via medications   Past Medical History: Past Medical History:  Diagnosis Date   Anemia    iron deficiency--noted when tried to give blood in past    Past Surgical History: Past Surgical History:  Procedure Laterality Date   NO PAST SURGERIES      Family History:  History reviewed. No pertinent family history.  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  Tobacco Use   Smoking status: Never   Smokeless tobacco: Never  Vaping Use   Vaping Use: Former  Substance and Sexual Activity   Alcohol use: No   Drug use: No   Sexual activity: Not Currently     Comment: no intercourse since 09/2016  Other Topics Concern   Not on file  Social History Narrative   Parents live in [place]   She is a Ship broker at [employer], Paramedic in business   Lives with roommates near campus   Social Determinants of Health   Financial Resource Strain: Not on file  Food Insecurity: No Food Insecurity (11/03/2017)   Hunger Vital Sign    Worried About Running Out of Food in the Last Year: Never true    Ran Out of Food in the Last Year: Never true  Transportation Needs: Unmet Transportation Needs (11/03/2017)   PRAPARE - Hydrologist (Medical): Yes    Lack of Transportation (Non-Medical): Yes  Physical Activity: Not on file  Stress: Not on file  Social Connections: Not on file  Intimate Partner Violence: Unknown (11/03/2017)   Humiliation, Afraid, Rape, and Kick questionnaire    Fear of Current or Ex-Partner: Not on file    Emotionally Abused: No    Physically Abused: No    Sexually Abused: Not on file    Allergy: No Known Allergies  Current Outpatient Medications: Medications Prior to  Admission  Medication Sig Dispense Refill Last Dose   cephALEXin (KEFLEX) 500 MG capsule Take 1 capsule (500 mg total) by mouth 2 (two) times daily. 14 capsule 0    fluconazole (DIFLUCAN) 150 MG tablet Take 1 tablet (150 mg total) by mouth daily. Take after you finish the antibiotic. 1 tablet 0      Hospital Medications: Current Facility-Administered Medications  Medication Dose Route Frequency Provider Last Rate Last Admin   amisulpride (BARHEMSYS) injection 10 mg  10 mg Intravenous Once PRN Nolon Nations, MD       HYDROmorphone (DILAUDID) injection 0.25-0.5 mg  0.25-0.5 mg Intravenous Q5 min PRN Nolon Nations, MD       lactated ringers infusion   Intravenous Continuous Nolon Nations, MD 10 mL/hr at 02/28/22 1629 New Bag at 02/28/22 1629   meperidine (DEMEROL) injection 6.25-12.5 mg  6.25-12.5 mg Intravenous Q5 min PRN Nolon Nations, MD        oxyCODONE (Oxy IR/ROXICODONE) immediate release tablet 5 mg  5 mg Oral Once PRN Nolon Nations, MD       Or   oxyCODONE (ROXICODONE) 5 MG/5ML solution 5 mg  5 mg Oral Once PRN Nolon Nations, MD       promethazine (PHENERGAN) injection 6.25-12.5 mg  6.25-12.5 mg Intravenous Q15 min PRN Nolon Nations, MD       scopolamine (TRANSDERM-SCOP) 1 MG/3DAYS 1.5 mg  1 patch Transdermal Q72H Nolon Nations, MD   1.5 mg at 02/28/22 1703     Physical Exam:  Patient Vitals for the past 24 hrs:  BP Temp Temp src Pulse Resp SpO2 Height Weight  02/28/22 1619 136/69 98.4 F (36.9 C) Oral 75 18 100 % 5' 7"$  (1.702 m) 94.3 kg  02/28/22 1530 120/71 -- -- 81 20 100 % -- --  02/28/22 1505 128/73 98.5 F (36.9 C) Oral 78 20 100 % -- --  02/28/22 1130 -- 98.4 F (36.9 C) Oral -- -- -- -- --  02/28/22 1124 (!) 138/112 -- -- 98 18 98 % -- --    Body mass index is 32.58 kg/m. General appearance: Well nourished, well developed female in no acute distress.  Neck:  Supple, normal appearance, and no thyromegaly Cardiovascular: S1, S2 normal, no murmur, rub or gallop, regular rate and rhythm Respiratory:  Clear to auscultation bilateral. Normal respiratory effort Abdomen: positive bowel sounds and no masses, hernias; diffusely non tender to palpation, non distended Neuro/Psych:  Normal mood and affect.  Skin:  Warm and dry.  Extremities: no clubbing, cyanosis, or edema.   Laboratory: Beta HCG: 25,664  Recent Labs  Lab 02/28/22 1134  WBC 5.0  HGB 11.9*  HCT 37.6  PLT 312   Recent Labs  Lab 02/28/22 1134  NA 137  K 3.5  CL 109  CO2 21*  BUN 7  CREATININE 0.49  CALCIUM 9.0  PROT 7.2  BILITOT 0.5  ALKPHOS 53  ALT 10  AST 14*  GLUCOSE 106*   No results for input(s): "APTT", "INR", "PTT" in the last 168 hours.  Invalid input(s): "DRHAPTT" Recent Labs  Lab 02/28/22 1426  [place] B NEG   Imaging:  ADDENDUM REPORT: 02/28/2022 13:39   ADDENDUM: Critical Value/emergent results  were called by telephone at the time of interpretation on 02/28/2022 at 1:39 pm to provider [facility] , who verbally acknowledged these results.     Electronically Signed   By: Kerby Moors M.D.   On: 02/28/2022 13:39    Addended by Kerby Moors, MD  on 02/28/2022  1:42 PM    Study Result  Narrative & Impression  CLINICAL DATA:  Pelvic pain and cramping   EXAM: OBSTETRIC <14 WK [place] AND TRANSVAGINAL OB US   TECHNIQUE: Both transabdominal and transvaginal ultrasound examinations were performed for complete evaluation of the gestation as well as the maternal uterus, adnexal regions, and pelvic cul-de-sac. Transvaginal technique was performed to assess early pregnancy.   COMPARISON:  None Available.   FINDINGS: Intrauterine gestational sac: None   Yolk sac:  Not Visualized.   Embryo:  Not Visualized.   Cardiac Activity: Not Visualized.   Maternal uterus/adnexae:   Right ovary: Normal. Corpus luteum identified within the right ovary   Left ovary: Not visualized   Other :Within the right adnexa there is a complex cystic mass measuring 3.8 x 2.5 x 2.8 cm. Within the internal cystic component of this mass a fetal pole is suspected cardiac activity is visualized. Heart rate within the suspected fetal pole is 120 bpm.   Free fluid:  Small volume of free fluid noted within the pelvis.   IMPRESSION: 1. No intrauterine gestational sac, yolk sac, or fetal pole identified. 2. Complex mass within the right adnexa adjacent to the right ovary. This has a internal cystic component containing what appears to be a fetal pole with cardiac activity. 3. Small volume of free fluid noted within the pelvis.   Electronically Signed: By: Kerby Moors M.D. On: 02/28/2022 13:25     Assessment: Ms. Simson is a 27 y.o. with stable right ectopic pregnancy  Plan: Images reviewed and I told her that I agree with ultrasound assessment. D/w pt that recommendation is for surgical  management given fetal heart beat. I d/w her that I recommend a laparoscopic right salpingectomy, all indicated procedures after r/b of surgery d/w her; she is amenable to this.  Plan is for d/c to home after surgery.   Patient will need rhogam in the PACU.   Durene Romans MD Attending Center for [facility])

## 2022-02-28 NOTE — Discharge Instructions (Addendum)
Laparoscopic Surgery Discharge Instructions  Instructions Following Laparoscopic Surgery You have just undergone a laparoscopic surgery.  The following list should answer your most common questions.  Although we will discuss your surgery and post-operative instructions with you prior to your discharge, this list will serve as a reminder if you fail to recall the details of what we discussed.  We will discuss your surgery once again in detail at your post-op visit in two to four weeks. If you haven't already done so, please call to make your appointment as soon as possible.  How you will feel: Although you have just undergone a major surgery, your recovery will be significantly shorter since the surgery was performed through much smaller incisions than the traditional approach.  You should feel slightly better each day.  If you suddenly feel much worse than the prior day, please call the clinic.  It's important during the early part of your recovery that you maintain some activity.  Walking is encouraged.  You will quicken your recovery by continued activity.  Incision:  Your incisions will be closed with dissolvable stitches or surgical adhesive (glue).  There may be Band-aids and/or Steri-strips covering your incisions.  If there is no drainage from the incisions you may remove the Band-aids in one to two days.  You may notice some minor bruising at the incision sites.  This is common and will resolve within several days.  Please inform us if the redness at the edges of your incision appears to be spreading.  If the skin around your incision becomes warm to the touch, or if you notice a pus-like drainage, please call the office.  Stairs/Driving/Activities: You may climb stairs if necessary.  If you've had general anesthesia, do not drive a car the rest of the day today.  You may begin light housework when you feel up to it, but avoid heavy lifting (more than 15-20lbs) or pushing until cleared for these  activities by your physician.  Hygiene:  Do not soak your incisions.  Showers are acceptable but you may not take a bath or swim in a pool.  Cleanse your incisions daily with soap and water.  Medications:  Please resume taking any medications that you were taking prior to the surgery.  If we have prescribed any new medications for you, please take them as directed.  Constipation:  It is fairly common to experience some difficulty in moving your bowels following major surgery.  Being active will help to reduce this likelihood. A diet rich in fiber and plenty of liquids is desirable.  If you do become constipated, a mild laxative such as Miralax, Milk of Magnesia, or Metamucil, or a stool softener such as Colace, is recommended.  General Instructions: If you develop a fever of 100.5 degrees or higher, please call the office number(s) below for physician on call.

## 2022-02-28 NOTE — Anesthesia Preprocedure Evaluation (Addendum)
Anesthesia Evaluation  Patient identified by MRN, date of birth, ID band Patient awake    Reviewed: Allergy & Precautions, NPO status , Patient's Chart, lab work & pertinent test results  Airway Mallampati: II  TM Distance: >3 FB Neck ROM: Full    Dental no notable dental hx. (+) Dental Advisory Given, Teeth Intact   Pulmonary neg pulmonary ROS   Pulmonary exam normal breath sounds clear to auscultation       Cardiovascular negative cardio ROS Normal cardiovascular exam Rhythm:Regular Rate:Normal     Neuro/Psych negative neurological ROS     GI/Hepatic negative GI ROS, Neg liver ROS,,,  Endo/Other  negative endocrine ROS    Renal/GU negative Renal ROS     Musculoskeletal negative musculoskeletal ROS (+)    Abdominal  (+) + obese  Peds  Hematology  (+) Blood dyscrasia, anemia   Anesthesia Other Findings   Reproductive/Obstetrics negative OB ROS                             Anesthesia Physical Anesthesia Plan  ASA: 2 and emergent  Anesthesia Plan: General   Post-op Pain Management: Toradol IV (intra-op)* and Tylenol PO (pre-op)*   Induction: Intravenous, Rapid sequence and Cricoid pressure planned  PONV Risk Score and Plan: 4 or greater and Ondansetron, Dexamethasone, Treatment may vary due to age or medical condition, Midazolam and Scopolamine patch - Pre-op  Airway Management Planned: Oral ETT  Additional Equipment:   Intra-op Plan:   Post-operative Plan: Extubation in OR  Informed Consent: I have reviewed the patients History and Physical, chart, labs and discussed the procedure including the risks, benefits and alternatives for the proposed anesthesia with the patient or authorized representative who has indicated his/her understanding and acceptance.     Dental advisory given  Plan Discussed with: CRNA  Anesthesia Plan Comments:         Anesthesia Quick  Evaluation

## 2022-02-28 NOTE — Transfer of Care (Signed)
Immediate Anesthesia Transfer of Care Note  Patient: Beth Camacho  Procedure(s) Performed: LAPAROSCOPIC RIGHT SALPINGECTOMY WITH REMOVAL OF ECTOPIC PREGNANCY (Right)  Patient Location: PACU  Anesthesia Type:General  Level of Consciousness: awake, alert , and oriented  Airway & Oxygen Therapy: Patient Spontanous Breathing and Patient connected to nasal cannula oxygen  Post-op Assessment: Report given to RN and Post -op Vital signs reviewed and stable  Post vital signs: Reviewed and stable  Last Vitals:  Vitals Value Taken Time  BP 149/103 02/28/22 1922  Temp    Pulse 104 02/28/22 1925  Resp 23 02/28/22 1925  SpO2 100 % 02/28/22 1925  Vitals shown include unvalidated device data.  Last Pain:  Vitals:   02/28/22 1619  TempSrc: Oral  PainSc: 0-No pain         Complications: No notable events documented.

## 2022-03-01 ENCOUNTER — Other Ambulatory Visit: Payer: Self-pay

## 2022-03-01 LAB — RH IG WORKUP (INCLUDES ABO/RH)
Gestational Age(Wks): 7
Unit division: 0

## 2022-03-01 NOTE — Discharge Summary (Addendum)
Gynecology Discharge Summary Date of Admission: 02/28/2022 Date of Discharge: 03/01/2022  The patient was admitted from the [facility] ED for a right tubal ectopic pregnancy, and she underwent an uncomplicated laparoscopic right salpingectomy; please refer to operative note for full details.  She was meeting all post op goals and discharged to home from the PACU after receiving rhogam.   Allergies as of 02/28/2022   No Known Allergies      Medication List     TAKE these medications    cephALEXin 500 MG capsule Commonly known as: KEFLEX Take 1 capsule (500 mg total) by mouth 2 (two) times daily.   docusate sodium 100 MG capsule Commonly known as: Colace Take 1 capsule (100 mg total) by mouth 2 (two) times daily for 10 days.   fluconazole 150 MG tablet Commonly known as: Diflucan Take 1 tablet (150 mg total) by mouth daily. Take after you finish the antibiotic.   ibuprofen 200 MG tablet Commonly known as: Motrin IB Take 3 tablets (600 mg total) by mouth every 6 (six) hours as needed.   oxyCODONE-acetaminophen 5-325 MG tablet Commonly known as: PERCOCET/ROXICET Take 1-2 tablets by mouth every 6 (six) hours as needed.        No future appointments. Request sent to clinic for 2-3wk post op visit.   Durene Romans MD Attending Center for [facility])

## 2022-03-02 ENCOUNTER — Encounter (HOSPITAL_COMMUNITY): Payer: Self-pay | Admitting: Obstetrics and Gynecology

## 2022-03-02 LAB — GC/CHLAMYDIA PROBE AMP (~~LOC~~) NOT AT ARMC
Chlamydia: NEGATIVE
Comment: NEGATIVE
Comment: NORMAL
Neisseria Gonorrhea: NEGATIVE

## 2022-03-02 NOTE — Anesthesia Postprocedure Evaluation (Addendum)
Anesthesia Post Note  Patient: Beth Camacho  Procedure(s) Performed: LAPAROSCOPIC RIGHT SALPINGECTOMY WITH REMOVAL OF ECTOPIC PREGNANCY (Right)     Patient location during evaluation: PACU Anesthesia Type: General Level of consciousness: sedated and patient cooperative Pain management: pain level controlled Vital Signs Assessment: post-procedure vital signs reviewed and stable Respiratory status: spontaneous breathing Cardiovascular status: stable Anesthetic complications: no   No notable events documented.  Last Vitals:  Vitals:   02/28/22 2030 02/28/22 2045  BP: 127/87 124/85  Pulse: 95 90  Resp: 19 19  Temp:  36.7 C  SpO2: 100% 100%    Last Pain:  Vitals:   02/28/22 2045  TempSrc:   PainSc: [place]

## 2022-03-03 LAB — SURGICAL PATHOLOGY

## 2022-03-26 ENCOUNTER — Encounter: Payer: Self-pay | Admitting: Obstetrics and Gynecology

## 2022-03-26 ENCOUNTER — Ambulatory Visit: Payer: Self-pay | Admitting: Obstetrics and Gynecology

## 2022-03-27 NOTE — Progress Notes (Signed)
Patient did not keep her GYN post op appointment for 03/26/2022.  Durene Romans MD Attending Center for [employer])

## 2023-09-25 ENCOUNTER — Emergency Department (HOSPITAL_BASED_OUTPATIENT_CLINIC_OR_DEPARTMENT_OTHER)
Admission: EM | Admit: 2023-09-25 | Discharge: 2023-09-25 | Disposition: A | Discharge status: Home / Self Care | Payer: Self-pay | Attending: Emergency Medicine | Admitting: Emergency Medicine

## 2023-09-25 ENCOUNTER — Emergency Department (HOSPITAL_BASED_OUTPATIENT_CLINIC_OR_DEPARTMENT_OTHER): Payer: Self-pay

## 2023-09-25 DIAGNOSIS — M25572 Pain in left ankle and joints of left foot: Secondary | ICD-10-CM | POA: Diagnosis present

## 2023-09-25 DIAGNOSIS — Y92410 Unspecified street and highway as the place of occurrence of the external cause: Secondary | ICD-10-CM | POA: Diagnosis not present

## 2023-09-25 DIAGNOSIS — M79645 Pain in left finger(s): Secondary | ICD-10-CM | POA: Diagnosis not present

## 2023-09-25 MED ORDER — ACETAMINOPHEN 325 MG PO TABS
650.0000 mg | ORAL_TABLET | Freq: Once | ORAL | Status: AC
  Administered 2023-09-25: 650 mg via ORAL
  Filled 2023-09-25: qty 2
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
## 2023-09-25 NOTE — Discharge Instructions (Signed)
 You have been seen and discharged from the emergency department.  Your x-ray imaging was negative for acute fracture.  However you may experience soreness over the next day or 2.  Take Tylenol /ibuprofen  as needed for pain control.  Stay well-hydrated.  Follow-up with your primary provider for further evaluation and further care. Take home medications as prescribed. If you have any worsening symptoms or further concerns for your health please return to an emergency department for further evaluation.
